# Patient Record
Sex: Male | Born: 1980
Health system: Southern US, Community
[De-identification: ages and names within clinical notes are randomized; demographics above are authoritative.]

## PROBLEM LIST (undated history)

## (undated) DIAGNOSIS — F1010 Alcohol abuse, uncomplicated: Secondary | ICD-10-CM

## (undated) DIAGNOSIS — F32A Depression, unspecified: Secondary | ICD-10-CM

## (undated) DIAGNOSIS — F329 Major depressive disorder, single episode, unspecified: Secondary | ICD-10-CM

---
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
## 2001-08-05 ENCOUNTER — Emergency Department (HOSPITAL_COMMUNITY): Admission: EM | Admit: 2001-08-05 | Discharge: 2001-08-05 | Payer: Self-pay | Admitting: *Deleted

## 2003-04-16 ENCOUNTER — Encounter: Payer: Self-pay | Admitting: Emergency Medicine

## 2003-04-16 ENCOUNTER — Emergency Department (HOSPITAL_COMMUNITY): Admission: EM | Admit: 2003-04-16 | Discharge: 2003-04-16 | Payer: Self-pay | Admitting: Emergency Medicine

## 2003-04-24 ENCOUNTER — Emergency Department (HOSPITAL_COMMUNITY): Admission: EM | Admit: 2003-04-24 | Discharge: 2003-04-24 | Payer: Self-pay | Admitting: Emergency Medicine

## 2015-05-21 ENCOUNTER — Ambulatory Visit (HOSPITAL_COMMUNITY): Payer: Self-pay | Admitting: Psychiatry

## 2015-09-26 ENCOUNTER — Encounter (HOSPITAL_COMMUNITY): Payer: Self-pay | Admitting: Emergency Medicine

## 2015-09-26 ENCOUNTER — Encounter (HOSPITAL_COMMUNITY): Payer: Self-pay

## 2015-09-26 ENCOUNTER — Emergency Department (HOSPITAL_COMMUNITY)
Admission: EM | Admit: 2015-09-26 | Discharge: 2015-09-26 | Disposition: A | Discharge status: Other Acute Inpatient Hospital | Payer: BLUE CROSS/BLUE SHIELD | Attending: Emergency Medicine | Admitting: Emergency Medicine

## 2015-09-26 ENCOUNTER — Inpatient Hospital Stay (HOSPITAL_COMMUNITY)
Admission: AD | Admit: 2015-09-26 | Discharge: 2015-09-29 | DRG: 885 | Disposition: A | Discharge status: Rehabilitation Facility | Payer: BLUE CROSS/BLUE SHIELD | Source: Intra-hospital | Attending: Psychiatry | Admitting: Psychiatry

## 2015-09-26 DIAGNOSIS — Z79899 Other long term (current) drug therapy: Secondary | ICD-10-CM | POA: Diagnosis not present

## 2015-09-26 DIAGNOSIS — F10239 Alcohol dependence with withdrawal, unspecified: Secondary | ICD-10-CM | POA: Diagnosis present

## 2015-09-26 DIAGNOSIS — R45851 Suicidal ideations: Secondary | ICD-10-CM | POA: Diagnosis present

## 2015-09-26 DIAGNOSIS — F1020 Alcohol dependence, uncomplicated: Secondary | ICD-10-CM | POA: Diagnosis present

## 2015-09-26 DIAGNOSIS — F10120 Alcohol abuse with intoxication, uncomplicated: Secondary | ICD-10-CM | POA: Diagnosis not present

## 2015-09-26 DIAGNOSIS — F419 Anxiety disorder, unspecified: Secondary | ICD-10-CM | POA: Diagnosis present

## 2015-09-26 DIAGNOSIS — F339 Major depressive disorder, recurrent, unspecified: Principal | ICD-10-CM | POA: Diagnosis present

## 2015-09-26 DIAGNOSIS — G4700 Insomnia, unspecified: Secondary | ICD-10-CM | POA: Diagnosis present

## 2015-09-26 DIAGNOSIS — Z880 Allergy status to penicillin: Secondary | ICD-10-CM | POA: Diagnosis not present

## 2015-09-26 DIAGNOSIS — F10920 Alcohol use, unspecified with intoxication, uncomplicated: Secondary | ICD-10-CM

## 2015-09-26 DIAGNOSIS — F1310 Sedative, hypnotic or anxiolytic abuse, uncomplicated: Secondary | ICD-10-CM | POA: Diagnosis present

## 2015-09-26 DIAGNOSIS — F17200 Nicotine dependence, unspecified, uncomplicated: Secondary | ICD-10-CM | POA: Diagnosis present

## 2015-09-26 DIAGNOSIS — Z818 Family history of other mental and behavioral disorders: Secondary | ICD-10-CM

## 2015-09-26 HISTORY — DX: Alcohol abuse, uncomplicated: F10.10

## 2015-09-26 LAB — COMPREHENSIVE METABOLIC PANEL
ALK PHOS: 61 U/L (ref 38–126)
ALT: 35 U/L (ref 17–63)
ANION GAP: 11 (ref 5–15)
AST: 49 U/L — ABNORMAL HIGH (ref 15–41)
Albumin: 4.6 g/dL (ref 3.5–5.0)
BUN: 8 mg/dL (ref 6–20)
CALCIUM: 9 mg/dL (ref 8.9–10.3)
CO2: 24 mmol/L (ref 22–32)
CREATININE: 0.67 mg/dL (ref 0.61–1.24)
Chloride: 105 mmol/L (ref 101–111)
Glucose, Bld: 117 mg/dL — ABNORMAL HIGH (ref 65–99)
Potassium: 3.9 mmol/L (ref 3.5–5.1)
Sodium: 140 mmol/L (ref 135–145)
Total Bilirubin: 0.7 mg/dL (ref 0.3–1.2)
Total Protein: 7.6 g/dL (ref 6.5–8.1)

## 2015-09-26 LAB — CBC
HEMATOCRIT: 45.2 % (ref 39.0–52.0)
HEMOGLOBIN: 15.7 g/dL (ref 13.0–17.0)
MCH: 33.3 pg (ref 26.0–34.0)
MCHC: 34.7 g/dL (ref 30.0–36.0)
MCV: 95.8 fL (ref 78.0–100.0)
Platelets: 296 10*3/uL (ref 150–400)
RBC: 4.72 MIL/uL (ref 4.22–5.81)
RDW: 12.5 % (ref 11.5–15.5)
WBC: 9.1 10*3/uL (ref 4.0–10.5)

## 2015-09-26 LAB — RAPID URINE DRUG SCREEN, HOSP PERFORMED
Amphetamines: NOT DETECTED
BENZODIAZEPINES: POSITIVE — AB
Barbiturates: NOT DETECTED
COCAINE: NOT DETECTED
OPIATES: NOT DETECTED
Tetrahydrocannabinol: NOT DETECTED

## 2015-09-26 LAB — ETHANOL
ALCOHOL ETHYL (B): 371 mg/dL — AB (ref <5)
Alcohol, Ethyl (B): 210 mg/dL — ABNORMAL HIGH (ref <5)

## 2015-09-26 MED ORDER — LORAZEPAM 1 MG PO TABS: 0.0000 mg | ORAL_TABLET | Freq: Two times a day (BID) | ORAL | Status: DC

## 2015-09-26 MED ORDER — CHLORDIAZEPOXIDE HCL 25 MG PO CAPS: 25.0000 mg | ORAL_CAPSULE | Freq: Every day | ORAL | Status: DC

## 2015-09-26 MED ORDER — VITAMIN B-1 100 MG PO TABS: 100.0000 mg | ORAL_TABLET | Freq: Every day | ORAL | Status: DC

## 2015-09-26 MED ORDER — LORAZEPAM 2 MG/ML IJ SOLN
0.0000 mg | Freq: Two times a day (BID) | INTRAMUSCULAR | Status: DC
  Filled 2015-09-26: qty 1

## 2015-09-26 MED ORDER — THIAMINE HCL 100 MG/ML IJ SOLN: 100.0000 mg | Freq: Once | INTRAMUSCULAR | Status: DC

## 2015-09-26 MED ORDER — LORAZEPAM 2 MG/ML IJ SOLN
0.0000 mg | Freq: Four times a day (QID) | INTRAMUSCULAR | Status: DC
  Administered 2015-09-26: 1 mg via INTRAVENOUS
  Filled 2015-09-26: qty 1

## 2015-09-26 MED ORDER — ALUM & MAG HYDROXIDE-SIMETH 200-200-20 MG/5ML PO SUSP: 30.0000 mL | ORAL | Status: DC | PRN

## 2015-09-26 MED ORDER — HYDROXYZINE HCL 25 MG PO TABS
25.0000 mg | ORAL_TABLET | Freq: Four times a day (QID) | ORAL | Status: DC | PRN
  Administered 2015-09-27 – 2015-09-28 (×2): 25 mg via ORAL
  Filled 2015-09-26 (×2): qty 1

## 2015-09-26 MED ORDER — MAGNESIUM HYDROXIDE 400 MG/5ML PO SUSP: 30.0000 mL | Freq: Every day | ORAL | Status: DC | PRN

## 2015-09-26 MED ORDER — CHLORDIAZEPOXIDE HCL 25 MG PO CAPS: 25.0000 mg | ORAL_CAPSULE | ORAL | Status: DC

## 2015-09-26 MED ORDER — ACETAMINOPHEN 325 MG PO TABS: 650.0000 mg | ORAL_TABLET | Freq: Four times a day (QID) | ORAL | Status: DC | PRN

## 2015-09-26 MED ORDER — THIAMINE HCL 100 MG/ML IJ SOLN: 100.0000 mg | Freq: Every day | INTRAMUSCULAR | Status: DC

## 2015-09-26 MED ORDER — LORAZEPAM 2 MG/ML IJ SOLN
0.0000 mg | Freq: Two times a day (BID) | INTRAMUSCULAR | Status: DC
Start: 2015-09-28 — End: 2015-09-26

## 2015-09-26 MED ORDER — CHLORDIAZEPOXIDE HCL 25 MG PO CAPS
25.0000 mg | ORAL_CAPSULE | Freq: Four times a day (QID) | ORAL | Status: AC
  Administered 2015-09-27 – 2015-09-28 (×6): 25 mg via ORAL
  Filled 2015-09-26 (×6): qty 1

## 2015-09-26 MED ORDER — VITAMIN B-1 100 MG PO TABS
100.0000 mg | ORAL_TABLET | Freq: Every day | ORAL | Status: DC
  Administered 2015-09-27 – 2015-09-29 (×3): 100 mg via ORAL
  Filled 2015-09-26 (×5): qty 1

## 2015-09-26 MED ORDER — LOPERAMIDE HCL 2 MG PO CAPS: 2.0000 mg | ORAL_CAPSULE | ORAL | Status: DC | PRN

## 2015-09-26 MED ORDER — NICOTINE 14 MG/24HR TD PT24
14.0000 mg | MEDICATED_PATCH | Freq: Every day | TRANSDERMAL | Status: DC
  Administered 2015-09-26: 14 mg via TRANSDERMAL
  Filled 2015-09-26 (×2): qty 1

## 2015-09-26 MED ORDER — CHLORDIAZEPOXIDE HCL 25 MG PO CAPS
25.0000 mg | ORAL_CAPSULE | Freq: Three times a day (TID) | ORAL | Status: AC
  Administered 2015-09-28 – 2015-09-29 (×3): 25 mg via ORAL
  Filled 2015-09-26 (×3): qty 1

## 2015-09-26 MED ORDER — LORAZEPAM 1 MG PO TABS: 0.0000 mg | ORAL_TABLET | Freq: Four times a day (QID) | ORAL | Status: DC

## 2015-09-26 MED ORDER — LORAZEPAM 2 MG/ML IJ SOLN
0.0000 mg | Freq: Four times a day (QID) | INTRAMUSCULAR | Status: DC
  Administered 2015-09-26: 2 mg via INTRAVENOUS

## 2015-09-26 MED ORDER — THIAMINE HCL 100 MG/ML IJ SOLN
100.0000 mg | Freq: Every day | INTRAMUSCULAR | Status: DC
  Filled 2015-09-26: qty 2

## 2015-09-26 MED ORDER — LEVETIRACETAM 500 MG PO TABS
500.0000 mg | ORAL_TABLET | Freq: Two times a day (BID) | ORAL | Status: DC
  Administered 2015-09-27 – 2015-09-29 (×5): 500 mg via ORAL
  Filled 2015-09-26 (×9): qty 1

## 2015-09-26 MED ORDER — THIAMINE HCL 100 MG/ML IJ SOLN
100.0000 mg | Freq: Every day | INTRAMUSCULAR | Status: DC
  Administered 2015-09-26: 100 mg via INTRAVENOUS
  Filled 2015-09-26: qty 2

## 2015-09-26 MED ORDER — ONDANSETRON 4 MG PO TBDP: 4.0000 mg | ORAL_TABLET | Freq: Four times a day (QID) | ORAL | Status: DC | PRN

## 2015-09-26 MED ORDER — ADULT MULTIVITAMIN W/MINERALS CH
1.0000 | ORAL_TABLET | Freq: Every day | ORAL | Status: DC
  Administered 2015-09-27 – 2015-09-29 (×3): 1 via ORAL
  Filled 2015-09-26 (×5): qty 1

## 2015-09-26 MED ORDER — LORAZEPAM 1 MG PO TABS
0.0000 mg | ORAL_TABLET | Freq: Four times a day (QID) | ORAL | Status: DC
Start: 2015-09-26 — End: 2015-09-26
  Administered 2015-09-26: 1 mg via ORAL
  Filled 2015-09-26: qty 1

## 2015-09-26 MED ORDER — CHLORDIAZEPOXIDE HCL 25 MG PO CAPS
25.0000 mg | ORAL_CAPSULE | Freq: Four times a day (QID) | ORAL | Status: DC | PRN
  Administered 2015-09-27: 25 mg via ORAL
  Filled 2015-09-26: qty 1

## 2015-09-26 NOTE — ED Provider Notes (Signed)
CSN: [id number]     Arrival date & time 09/26/15  1015 History   First MD Initiated Contact with Patient 09/26/15 1133     Chief Complaint  Patient presents with  . Alcohol Intoxication  . Suicidal   LEVEL 5 CAVEAT DUE TO INTOXICATION  Patient is a 35 y.o. male presenting with intoxication. The history is provided by the patient.  Alcohol Intoxication This is a new problem. Episode onset: unknown. The problem occurs constantly. The problem has been gradually worsening. Nothing aggravates the symptoms. Nothing relieves the symptoms.  patient is here for alcohol intoxication and also for suicidal ideation He also reports abdominal pain  Past Medical History  Diagnosis Date  . Alcohol abuse    No past surgical history on file. No family history on file. Social History  Substance Use Topics  . Smoking status: Not on file  . Smokeless tobacco: Not on file  . Alcohol Use: Not on file    Review of Systems  Unable to perform ROS: Mental status change      Allergies  Penicillins and Sulfa antibiotics  Home Medications   Prior to Admission medications   Medication Sig Start Date End Date Taking? Authorizing Provider  chlordiazePOXIDE (LIBRIUM) 10 MG capsule Take 10 mg by mouth 2 (two) times daily. 08/12/15  Yes Historical Provider, MD  levETIRAcetam (KEPPRA) 500 MG tablet Take 500 mg by mouth 2 (two) times daily. 04/16/15 04/15/16 Yes Historical Provider, MD   BP 133/101 mmHg  Pulse 112  Temp(Src) 97.5 F (36.4 C) (Oral)  Resp 20  SpO2 97% Physical Exam CONSTITUTIONAL: Disheveled, smells of ETOH HEAD: Normocephalic/atraumatic EYES: EOMI ENMT: Mucous membranes moist, tobacco in mouth NECK: supple no meningeal signs SPINE/BACK:entire spine nontender CV: S1/S2 noted, no murmurs/rubs/gallops noted LUNGS: Lungs are clear to auscultation bilaterally, no apparent distress ABDOMEN: soft, nontender NEURO: Pt is awake/alert/appropriate, moves all extremitiesx4. Pt is able to  ambulate EXTREMITIES:  full ROM SKIN: warm, color normal PSYCH: unable to fully assess  ED Course  Procedures  2:07 PM Pt will need psych evaluation  Labs Review Labs Reviewed  ETHANOL - Abnormal; Notable for the following:    Alcohol, Ethyl (B) 371 (*)    All other components within normal limits  COMPREHENSIVE METABOLIC PANEL - Abnormal; Notable for the following:    Glucose, Bld 117 (*)    AST 49 (*)    All other components within normal limits  CBC  URINE RAPID DRUG SCREEN, HOSP PERFORMED    I have personally reviewed and evaluated these  lab results as part of my medical decision-making.  Medications  thiamine (VITAMIN B-1) tablet 100 mg (not administered)    Or  thiamine (B-1) injection 100 mg (not administered)  LORazepam (ATIVAN) tablet 0-4 mg (0 mg Oral Hold 09/26/15 1335)    Or  LORazepam (ATIVAN) injection 0-4 mg ( Intravenous See Alternative 09/26/15 1335)  LORazepam (ATIVAN) tablet 0-4 mg (not administered)    Or  LORazepam (ATIVAN) injection 0-4 mg (not administered)    MDM   Final diagnoses:  Alcohol intoxication, uncomplicated (HCC)  Suicidal ideation    Nursing notes including past medical history and social history reviewed and considered in documentation Labs/vital reviewed myself and considered during evaluation     Zadie Rhine, MD 09/26/15 1407

## 2015-09-26 NOTE — ED Notes (Signed)
Meal tray was given to pt.  

## 2015-09-26 NOTE — BH Assessment (Signed)
Received information from [facility], RN, [facility] who states that patients diastolic blood pressure - bottom number should be retested and below 100 and requested his BAL be rechecked after 6:00PM for patient to be considered at [facility]. Patients nurse information.    Davina Poke, LCSW Therapeutic Triage Specialist [facility] 09/26/2015 3:28 PM

## 2015-09-26 NOTE — ED Notes (Signed)
Pt was at a friends house and expressed SI with no plan and was drinking heavy amounts of liquor and beer. They called a friend that dropped pt off at ER. Pt expresses that he does have some thoughts of SI due to stress and drinking, he drinks heavy daily, abd pain with this. Not able to stand, gait off balance. C/o nausea, dizziness.

## 2015-09-26 NOTE — ED Notes (Addendum)
Pt is asleep on his back with regular respirations. He is to be transported to [facility] in about one hour. Pt does appear comfortable. 8:15pm _pt given a pitcher of gatorade to drink. He denies Si and HI. CIWA is a 1. 8:30pm _Phoned [facility] and was asked to hold off sending the pt until they complete the present admission that they have. Per [facility] they will phone me back. 9:40pm _CIWA is a 5. Phoned [facility] to give report. Report given to [facility]. Will phone [facility] at 10pm,. Pt given  of ativan IV. 10pm Pts IV pulled.Pt stated he knows he has a drinking problem and often times will try to detox himself. He stated,'I need to talk to my attorney because I think I have to get into a 30 day program before my court date which is March 17th 2017. "Per [facility] they will be here in 25-30minutes to transport the pt. Pt left via [facility] at 10:20pm -Phoned [facility] to notify them pt is on his way

## 2015-09-26 NOTE — ED Notes (Signed)
Per Kinesha at [facility], pt can be transferred there in about an hour when his ETOH should be below 200

## 2015-09-26 NOTE — BH Assessment (Signed)
Assessment completed. Consulted with Dahlia Byes, NP who recommends inpatient treatment at this time.  Contacted Gretta Arab, RN, [facility] for bed availability at [facility].   Davina Poke, LCSW Therapeutic Triage Specialist [facility] 09/26/2015 3:12 PM

## 2015-09-26 NOTE — ED Notes (Signed)
ETOH 371 - Alerted primary nurse

## 2015-09-26 NOTE — BH Assessment (Addendum)
Assessment Note  Steven Gonzalez is an 35 y.o. male presenting to [facility] voluntarily stating that he "drank last night and woke up and called a friend and said I would be a little bit late, showed up to work and pretty much decided that it's time to check myself in." Patient states that he spent most of the day on the phone with [facility] who told him that he would have to pat $4600 to check in.  Patient states that "a friend of mine had his mom come pick me up and take me here." Patient states that he came in requesting detox. Patient states that he told a friend at work that he "has a problem with alcohol and I need to check myself in" and a friend from work called his mother to transport him to the hospital.  Patient states that he had a passive fleeting suicidal thought "like why should I get up, I don't want to be here" but states that he did not have intent or plan and that thought passed.  Patient states that he had one suicide attempt in September of 2016 where he overdosed and was admitted to [facility] but has not had any attempts since that time. Patient denies current SI with no intent or plan at this time. Patient denies self injurious behaviors. Patient denies HI and history of aggression. Patient states that he has an upcoming court date of October 22, 2015 for a DUI and denies other pending charges and states that he is not on probation.  Patient denies AVH and does not appear to be responding to internal stimuli. Patient states that he started drinking at the age of 89 and drinks at least "six to eight drinks of beer and liquor mixed" daily. Patient states that he has experienced DT's in the past and "convulsions" but denies having seizures. Patient BAL is 371 at time of arrival and UDS not collected at time of assessment.   Patient is alert and oriented x4. Patient appeared drowsy at first as he had just been given  of Ativan but appeared to answer questions appropriately with partially  impaired judgement.  Patient states that he has been seeing Dr. Evelene Croon for "a couple months" and his last appointment was Friday and he states that Dr. Evelene Croon stopped his librium although he is not sure why because he was not drinking while taking the medication. Patient states that he saw a therapist once but stopped going because the therapist was a "dip shit" and "did not know what he was doing." Patient states that he attempted suicide in September stating that his main stressor was "life" and "alcoholism" and he "took like fifty pills" and was admitted to [facility] for about one week. Patient denies other inpatient hospitalizations. Patient denies having access to weapons. Patient denies physical abuse and requested to skip the sexual abuse questions. Patient states that his family is very supportive. Patient requested to be referred to a "court approved" treatment facility for his upcoming court date in March.    Consulted with Dahlia Byes, PMH-NP who recommends inpatient treatment at this time.   Diagnosis: Alcohol use disorder, Severe         Unspecified depressive disorder  Past Medical History:  Past Medical History  Diagnosis Date  . Alcohol abuse     No past surgical history on file.  Family History: No family history on file.  Social History:  has no tobacco, alcohol, and drug history on file.  Additional Social History:  Alcohol / Drug Use Pain Medications: See PTA Prescriptions: See PTA Over the Counter: See PTA History of alcohol / drug use?: Yes Longest period of sobriety (when/how long): 90 days Negative Consequences of Use: Legal Withdrawal Symptoms: Tremors, Other (Comment) ("convulsions") Substance #1 Name of Substance 1: Alcohol 1 - Age of First Use: 18 1 - Amount (size/oz): 6-12 beers or combined with liquor 1 - Frequency: daily 1 - Duration: ongoing 1 - Last Use / Amount: this morning, UKN amount  CIWA: CIWA-Ar BP: (!) 133/101 mmHg Pulse Rate:  112 Nausea and Vomiting: no nausea and no vomiting Tactile Disturbances: none Tremor: moderate, with patient's arms extended Auditory Disturbances: not present Paroxysmal Sweats: beads of sweat obvious on forehead Visual Disturbances: not present Anxiety: three Headache, Fullness in Head: mild Agitation: moderately fidgety and restless Orientation and Clouding of Sensorium: oriented and can do serial additions CIWA-Ar Total: 17 COWS:    Allergies:  Allergies  Allergen Reactions  . Penicillins Anaphylaxis    Child hood allergy.  Has patient had a PCN reaction causing immediate rash, facial/tongue/throat swelling, SOB or lightheadedness with hypotension: Yes Has patient had a PCN reaction causing severe rash involving mucus membranes or skin necrosis: Yes Has patient had a PCN reaction that required hospitalization Yes Has patient had a PCN reaction occurring within the last 10 years: No If all of the above answers are "NO", then may proceed with   . Sulfa Antibiotics Anaphylaxis    Home Medications:  (Not in a hospital admission)  OB/GYN Status:  No LMP for male patient.  General Assessment Data [facility]: [facility] Assessment: In system Is this a Tele or Face-to-Face Assessment?: Face-to-Face Is this an Initial Assessment or a Re-assessment for this encounter?: Initial Assessment Marital status: Divorced (and engaged) Is patient pregnant?: No Pregnancy Status: No Living Arrangements: Alone Can pt return to current living arrangement?: Yes Admission Status: Voluntary Is patient capable of signing voluntary admission?: Yes Referral Source: Self/Family/Friend Insurance type: BCBS     Crisis Care Plan Living Arrangements: Alone Name of Psychiatrist: Dr. Evelene Croon ("a couple months" last appt Friday) Name of Therapist: None  Education Status Is patient currently in school?: No Highest grade of school patient has completed: 10th  Risk to self with the past  6 months Suicidal Ideation: No Has patient been a risk to self within the past 6 months prior to admission? : Yes Suicidal Intent: No Has patient had any suicidal intent within the past 6 months prior to admission? : Yes Is patient at risk for suicide?: No Suicidal Plan?: No Has patient had any suicidal plan within the past 6 months prior to admission? : Yes Access to Means: No What has been your use of drugs/alcohol within the last 12 months?: Alcohol daily Previous Attempts/Gestures: Yes How many times?: 1 (overdose in September "took like 50 pills") Other Self Harm Risks: Denies Triggers for Past Attempts: Other (Comment) ("alcoholism") Intentional Self Injurious Behavior: None Family Suicide History: No Recent stressful life event(s): Other (Comment) ("life") Persecutory voices/beliefs?: No Depression: Yes Depression Symptoms: Fatigue, Guilt, Loss of interest in usual pleasures Substance abuse history and/or treatment for substance abuse?: Yes Suicide prevention information given to non-admitted patients: Not applicable  Risk to Others within the past 6 months Homicidal Ideation: No Does patient have any lifetime risk of violence toward others beyond the six months prior to admission? : No Thoughts of Harm to Others: No Current Homicidal Intent: No Current Homicidal  Plan: No Access to Homicidal Means: No Identified Victim: Denies History of harm to others?: No Assessment of Violence: None Noted Violent Behavior Description: Denies Does patient have access to weapons?: No (patient states that someone else has his weapons) Criminal Charges Pending?: Yes Describe Pending Criminal Charges: DUI Does patient have a court date: Yes Court Date: 10/22/15 Is patient on probation?: No  Psychosis Hallucinations: None noted Delusions: None noted  Mental Status Report Appearance/Hygiene: In scrubs Eye Contact: Fair Motor Activity: Unable to assess Speech:  Logical/coherent Level of Consciousness: Alert Mood: Anxious Affect: Anxious Anxiety Level: Moderate Thought Processes: Coherent, Relevant Judgement: Partial Orientation: Person, Place, Time, Situation, Appropriate for developmental age Obsessive Compulsive Thoughts/Behaviors: None  Cognitive Functioning Concentration: Decreased Memory: Recent Intact, Remote Intact IQ: Average Insight: Fair Impulse Control: Fair Appetite: Fair Weight Loss: 0 Weight Gain: 0 Sleep:  (varies) Vegetative Symptoms: None  ADLScreening [facility] Assessment Services) Patient's cognitive ability adequate to safely complete daily activities?: Yes Patient able to express need for assistance with ADLs?: Yes Independently performs ADLs?: Yes (appropriate for developmental age)  Prior Inpatient Therapy Prior Inpatient Therapy: Yes Prior Therapy Dates: 04/2015 Prior Therapy Facilty/Provider(s): [facility] Reason for Treatment: SI  Prior Outpatient Therapy Prior Outpatient Therapy: Yes Prior Therapy Dates: Present Prior Therapy Facilty/Provider(s): Dr. Evelene Croon Reason for Treatment: SA Does patient have an ACCT team?: No Does patient have Intensive In-House Services?  : No Does patient have [facility] services? : No Does patient have P4CC services?: No  ADL Screening (condition at time of admission) Patient's cognitive ability adequate to safely complete daily activities?: Yes Is the patient deaf or have difficulty hearing?: No Does the patient have difficulty seeing, even when wearing glasses/contacts?: No Does the patient have difficulty concentrating, remembering, or making decisions?: No Patient able to express need for assistance with ADLs?: Yes Does the patient have difficulty dressing or bathing?: No Independently performs ADLs?: Yes (appropriate for developmental age) Does the patient have difficulty walking or climbing stairs?: No Weakness of Legs: None Weakness of Arms/Hands: None  Home Assistive  Devices/Equipment Home Assistive Devices/Equipment: None  Therapy Consults (therapy consults require a physician order) PT Evaluation Needed: No OT Evalulation Needed: No SLP Evaluation Needed: No Abuse/Neglect Assessment (Assessment to be complete while patient is alone) Physical Abuse: Denies Verbal Abuse: Denies Sexual Abuse:  (prefers not to answer, feels safe now) Exploitation of patient/patient's resources: Denies Self-Neglect: Denies Values / Beliefs Cultural Requests During Hospitalization: None Spiritual Requests During Hospitalization: None Consults Spiritual Care Consult Needed: No Social Work Consult Needed: No Merchant navy officer (For Healthcare) Does patient have an advance directive?: No Would patient like information on creating an advanced directive?: No - patient declined information    Additional Information 1:1 In Past 12 Months?: No CIRT Risk: No Elopement Risk: No Does patient have medical clearance?: No     Disposition:  Disposition Initial Assessment Completed for this Encounter: Yes  On Site Evaluation by:   Reviewed with Physician:    Claus Silvestro 09/26/2015 2:51 PM

## 2015-09-27 ENCOUNTER — Encounter (HOSPITAL_COMMUNITY): Payer: Self-pay | Admitting: Psychiatry

## 2015-09-27 DIAGNOSIS — F1020 Alcohol dependence, uncomplicated: Secondary | ICD-10-CM

## 2015-09-27 DIAGNOSIS — F339 Major depressive disorder, recurrent, unspecified: Principal | ICD-10-CM

## 2015-09-27 MED ORDER — NICOTINE POLACRILEX 2 MG MT GUM
2.0000 mg | CHEWING_GUM | OROMUCOSAL | Status: DC | PRN
  Administered 2015-09-27 – 2015-09-29 (×9): 2 mg via ORAL
  Filled 2015-09-27 (×2): qty 1

## 2015-09-27 NOTE — Progress Notes (Signed)
Pt did not attend evening AA group. Pt was in bed.  
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
## 2015-09-27 NOTE — Tx Team (Signed)
Initial Interdisciplinary Treatment Plan   PATIENT STRESSORS: Financial difficulties Legal issue Medication change or noncompliance Substance abuse   PATIENT STRENGTHS: Active sense of humor Average or above average intelligence Capable of independent living General fund of knowledge Motivation for treatment/growth Supportive family/friends   PROBLEM LIST: Problem List/Patient Goals Date to be addressed Date deferred Reason deferred Estimated date of resolution  "go to a long term treatment center" 09/26/2015     depression 09/26/2015     anxiety 09/26/2015     Risk for suicide 09/26/2015     Substance abuse 09/26/2015                              DISCHARGE CRITERIA:  Improved stabilization in mood, thinking, and/or behavior Motivation to continue treatment in a less acute level of care Reduction of life-threatening or endangering symptoms to within safe limits Withdrawal symptoms are absent or subacute and managed without 24-hour nursing intervention  PRELIMINARY DISCHARGE PLAN: Attend aftercare/continuing care group Attend 12-step recovery group Return to previous living arrangement Return to previous work or school arrangements  PATIENT/FAMIILY INVOLVEMENT: This treatment plan has been presented to and reviewed with the patient, Steven Gonzalez, The patient and family have been given the opportunity to ask questions and make suggestions.  JEHU-APPIAH, Erasmus Bistline K 09/27/2015, 1:25 AM

## 2015-09-27 NOTE — [facility] Group Notes (Signed)
[facility] LCSW Group Therapy 09/27/2015  1:15 PM   Type of Therapy: Group Therapy  Participation Level: Did Not Attend. Patient invited to participate but declined.   Elimelech Houseman, MSW, LCSW Clinical Social Worker [facility] [phone]   

## 2015-09-27 NOTE — Progress Notes (Signed)
Patient ID: Steven Gonzalez, male   DOB: 1981/03/07, 35 y.o.   MRN: [id number] oluntary admission in no acute distress for Admission note: D:Patient is a voluntary admission in no acute distress for anxiety, depression and substance abuse. Pt reports he has been drinking since age of 81. Pt reports he drinks 6 beers and 4 shots daily. Pt reports he is divorced but lives with finance and her child. Pt reports he was diagnosed with adjustment disorder and prescribed librium 30 mg BID. Pt stated his psychiatrist (Dr. Westley Chandler) discontinued the Librium and last dose was 09/21/2015. Pt reports hx of seizures with withdrawals and takes keppra. Pt reports 2 DUI with court date on 10/22/2015. Pt goal is to get into a long term treatment.  A: Pt admitted to unit per protocol, skin assessment and belonging search done. No skin issues noted. Consent signed by pt. Pt educated on therapeutic milieu rules. Pt was introduced to milieu by nursing staff. Fall risk safety plan explained to the patient. 15 minutes checks started for safety.  R: Pt was receptive to education. Writer offered support.

## 2015-09-27 NOTE — [facility] Group Notes (Signed)
[facility] LCSW Aftercare Discharge Planning Group Note  09/27/2015  8:45 AM  Participation Quality: Did Not Attend. Patient invited to participate but declined.  Garhett Bernhard, MSW, LCSW Clinical Social Worker [facility] [phone]   

## 2015-09-27 NOTE — Progress Notes (Signed)
DAR NOTE: Pt present with flat affect and depressed mood in the unit. Pt has been seen in the day room interacting with peers in the dayroom. Pt denies physical pain, took all his meds as scheduled. As per self inventory, pt had a fair night sleep, fair appetite, low energy, and good concentration. Pt rate depression at 2, hopeless ness at 2, and anxiety at 5. Pt goal for today is " speak with someone about long term treatment." Pt's safety ensured with 15 minute and environmental checks. Pt currently denies SI/HI and A/V hallucinations. Pt verbally agrees to seek staff if SI/HI or A/VH occurs and to consult with staff before acting on these thoughts. Will continue POC.

## 2015-09-28 MED ORDER — TRAZODONE HCL 50 MG PO TABS
50.0000 mg | ORAL_TABLET | Freq: Every evening | ORAL | Status: DC | PRN
  Administered 2015-09-28 (×2): 50 mg via ORAL
  Filled 2015-09-28 (×8): qty 1
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
## 2015-09-28 NOTE — Tx Team (Addendum)
Interdisciplinary Treatment Plan Update (Adult) Date: 09/28/2015    Time Reviewed: 9:30 AM  Progress in Treatment: Attending groups: Continuing to assess, patient new to milieu Participating in groups: Continuing to assess, patient new to milieu Taking medication as prescribed: Yes Tolerating medication: Yes Family/Significant other contact made: No, patient has declined for collateral contacts Patient understands diagnosis: Yes Discussing patient identified problems/goals with staff: Yes Medical problems stabilized or resolved: Yes Denies suicidal/homicidal ideation: Yes Issues/concerns per patient self-inventory: Yes Other:  New problem(s) identified: N/A  Discharge Plan or Barriers: Patient plans to go to [facility] [place] for continued treatment.  Reason for Continuation of Hospitalization:  Depression Anxiety Medication Stabilization   Comments: N/A  Estimated length of stay: Discharge anticipated for 09/29/15    Patient is a 35 year old male with a diagnosis of Substance Induced Mood Disorder and Alcohol Use Disorder. Pt presented to the hospital with alcohol abuse. Pt reports primary trigger(s) for admission was relationship and legal issues. Patient will benefit from crisis stabilization, medication evaluation, group therapy and psycho education in addition to case management for discharge planning. At discharge, it is recommended that Pt remain compliant with established discharge plan and continued treatment.   Review of initial/current patient goals per problem list:  1. Goal(s): Patient will participate in aftercare plan   Met: Yes   Target date: 3-5 days post admission date   As evidenced by: Patient will participate within aftercare plan AEB aftercare provider and housing plan at discharge being identified.  2/21: Goal not met: CSW assessing for appropriate referrals for pt and will have follow up secured prior to d/c.  2/22: Goal met. Patient plans  to go to [facility] [place] for continued treatment.     2. Goal (s): Patient will exhibit decreased depressive symptoms and suicidal ideations.   Met: Adequate for discharge per MD   Target date: 3-5 days post admission date   As evidenced by: Patient will utilize self rating of depression at 3 or below and demonstrate decreased signs of depression or be deemed stable for discharge by MD.  2/21: Goal not met: Pt presents with flat affect and depressed mood.  Pt admitted with depression rating of 10.  Pt to show decreased sign of depression and a rating of 3 or less before d/c.    2/22: Adequate for discharge. Patient reports an improvement in his symptoms, reports feeling safe for discharge today. Denies SI.    3. Goal(s): Patient will demonstrate decreased signs and symptoms of anxiety.   Met: Adequate for discharge per MD   Target date: 3-5 days post admission date   As evidenced by: Patient will utilize self rating of anxiety at 3 or below and demonstrated decreased signs of anxiety, or be deemed stable for discharge by MD   2/21: Goal not met: Pt presents with anxious mood and affect.  Pt admitted with anxiety rating of 10.  Pt to show decreased sign of anxiety and a rating of 3 or less before d/c.  2/22: Adequate for discharge. Patient reports an improvement in his symptoms, reports feeling safe for discharge today.    4. Goal(s): Patient will demonstrate decreased signs of withdrawal due to substance abuse   Met: Yes   Target date: 3-5 days post admission date   As evidenced by: Patient will produce a CIWA/COWS score of 0, have stable vitals signs, and no symptoms of withdrawal   2/21: Goal not met: Pt continues to have withdrawal  symptoms of tremor, sweating, anxiety and a CIWA score of a 5.  Pt to show decrease withdrawal symptoms prior to d/c.  2/22: Goal met. No withdrawal symptoms reported at this time per medical chart.     Attendees: Patient:     Family:    Physician: Dr. Parke Poisson; Dr. Sabra Heck 09/28/2015 9:30 AM  Nursing: Mayra Neer, Loletta Specter, [address], [place], [place] 09/28/2015 9:30 AM  Clinical Social Worker: Tilden Fossa, LCSW 09/28/2015 9:30 AM  Other: Peri Maris, LCSWA; [place], LCSW  09/28/2015 9:30 AM  Other:  09/28/2015 9:30 AM  Other: Lars Pinks, Case Manager 09/28/2015 9:30 AM  Other: Agustina Caroli, NP 09/28/2015 9:30 AM  Other:    Other:      Scribe for Treatment Team:  Tilden Fossa, [place]

## 2015-09-28 NOTE — Progress Notes (Signed)
Adult Psychoeducational Group Note  Date:  09/28/2015 Time:  8:20 PM  Group Topic/Focus:  Wrap-Up Group:   The focus of this group is to help patients review their daily goal of treatment and discuss progress on daily workbooks.  Participation Level:  Active  Participation Quality:  Appropriate  Affect:  Appropriate  Cognitive:  Appropriate  Insight: Appropriate  Engagement in Group:  Engaged  Modes of Intervention:  Discussion  Additional Comments:  Pt was pleasant during wrap-up group. Pt rated his overall day an 8 out of 10 because he achieve his goal for the day, which was "to put a plan together".   Cleotilde Neer 09/28/2015, 9:22 PM

## 2015-09-28 NOTE — Progress Notes (Deleted)
[facility] MD Progress Note  09/28/2015 6:01 PM [name]  MRN:  [id number] Subjective:  Steven Gonzalez is trying to get himself together. He is wanting to continue to work on his alcohol dependence by coming to a CD IOP. States that he does not want to got to a residential treatment program. 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 States he works with his parents. His father had agreed to take some of the pressure from him. States that the stress from work has set  the stage for his relapse many times Principal Problem: Major depressive disorder, recurrent, unspecified (HCC) Diagnosis:   Patient Active Problem List   Diagnosis Date Noted  . Alcohol use disorder, severe, dependence (HCC) [F10.20] 09/27/2015  . Major depressive disorder, recurrent, unspecified (HCC) [F33.9] 09/26/2015   Total Time spent with patient: 20 minutes  Past Psychiatric History: see admission H and P  Past Medical History:  Past Medical History  Diagnosis Date  . Alcohol abuse    History reviewed. No pertinent past surgical history. Family History: History reviewed. No pertinent family history. Family Psychiatric  History: see admission H and P Social History:  History  Alcohol Use: Not on file     History  Drug Use Not on file    Social History   Social History  . Marital Status: Single    Spouse Name: N/A  . Number of Children: N/A  . Years of Education: N/A   Social History Main Topics  . Smoking status: Current Every Day Smoker  . Smokeless tobacco: Current User    Types: Chew  . Alcohol Use: None  . Drug Use: None  . Sexual Activity: Yes   Other Topics Concern  . None   Social History Narrative   Additional Social History:                         Sleep: Fair  Appetite:  Fair  Current Medications: Current Facility-Administered Medications  Medication Dose Route Frequency  Provider Last Rate Last Dose  . acetaminophen (TYLENOL) tablet 650 mg  650 mg Oral Q6H PRN Worthy Flank, NP      . alum & mag hydroxide-simeth (MAALOX/MYLANTA) 200-200-20 MG/5ML suspension 30 mL  30 mL Oral Q4H PRN Worthy Flank, NP      . chlordiazePOXIDE (LIBRIUM) capsule 25 mg  25 mg Oral Q6H PRN Worthy Flank, NP   25 mg at 09/27/15 4098  . chlordiazePOXIDE (LIBRIUM) capsule 25 mg  25 mg Oral TID Worthy Flank, NP   25 mg at 09/28/15 1725   Followed by  . [START ON 09/29/2015] chlordiazePOXIDE (LIBRIUM) capsule 25 mg  25 mg Oral BH-qamhs Worthy Flank, NP       Followed by  . [START ON 10/01/2015] chlordiazePOXIDE (LIBRIUM) capsule 25 mg  25 mg Oral Daily Worthy Flank, NP      . hydrOXYzine (ATARAX/VISTARIL) tablet 25 mg  25 mg Oral Q6H PRN Worthy Flank, NP   25 mg at 09/27/15 2214  . levETIRAcetam (KEPPRA) tablet 500 mg  500 mg Oral BID Worthy Flank, NP   500 mg at 09/28/15 1725  . loperamide (IMODIUM) capsule 2-4 mg  2-4 mg Oral PRN Worthy Flank, NP      . magnesium hydroxide (MILK OF MAGNESIA) suspension 30 mL  30  mL Oral Daily PRN Worthy Flank, NP      . multivitamin with minerals tablet 1 tablet  1 tablet Oral Daily Worthy Flank, NP   1 tablet at 09/28/15 0827  . nicotine polacrilex (NICORETTE) gum 2 mg  2 mg Oral PRN Rachael Fee, MD   2 mg at 09/28/15 1311  . ondansetron (ZOFRAN-ODT) disintegrating tablet 4 mg  4 mg Oral Q6H PRN Worthy Flank, NP      . thiamine (B-1) injection 100 mg  100 mg Intramuscular Once Worthy Flank, NP   100 mg at 09/27/15 0009  . thiamine (VITAMIN B-1) tablet 100 mg  100 mg Oral Daily Worthy Flank, NP   100 mg at 09/28/15 4098    Lab Results: No results found for this or any previous visit (from the past 48 hour(s)).  Blood Alcohol level:  Lab Results  Component Value Date   ETH 371* 09/26/2015   ETH 210* 09/26/2015    Physical Findings: AIMS: Facial and Oral Movements Muscles of Facial Expression: None,  normal Lips and Perioral Area: None, normal Jaw: None, normal Tongue: None, normal,Extremity Movements Upper (arms, wrists, hands, fingers): None, normal Lower (legs, knees, ankles, toes): None, normal, Trunk Movements Neck, shoulders, hips: None, normal, Overall Severity Severity of abnormal movements (highest score from questions above): None, normal Incapacitation due to abnormal movements: None, normal Patient's awareness of abnormal movements (rate only patient's report): No Awareness, Dental Status Current problems with teeth and/or dentures?: No Does patient usually wear dentures?: No  CIWA:  CIWA-Ar Total: 3 COWS:     Musculoskeletal: Strength & Muscle Tone: within normal limits Gait & Station: normal Patient leans: normal  Psychiatric Specialty Exam: Review of Systems  Constitutional: Negative.   HENT: Negative.   Eyes: Negative.   Respiratory: Negative.   Cardiovascular: Negative.   Gastrointestinal: Negative.   Genitourinary: Negative.   Musculoskeletal: Negative.   Skin: Negative.   Neurological: Negative.   Endo/Heme/Allergies: Negative.   Psychiatric/Behavioral: Positive for depression and substance abuse. The patient is nervous/anxious.     Blood pressure 135/80, pulse 75, temperature 97.6 F (36.4 C), temperature source Oral, resp. rate 12, height  (1.626 m), weight 68.947 kg (152 lb).Body mass index is 26.08 kg/(m^2).  General Appearance: Fairly Groomed  Patent attorney::  Fair  Speech:  Clear and Coherent  Volume:  Normal  Mood:  Euthymic  Affect:  Restricted  Thought Process:  Coherent and Goal Directed  Orientation:  Full (Time, Place, and Person)  Thought Content:  symptoms events worries concerns  Suicidal Thoughts:  No  Homicidal Thoughts:  No  Memory:  Immediate;   Fair Recent;   Fair Remote;   Fair  Judgement:  Fair  Insight:  Present and Shallow  Psychomotor Activity:  Restlessness  Concentration:  Fair  Recall:  [employer] of  Knowledge:Fair  Language: Fair  Akathisia:  No  Handed:  Right  AIMS (if indicated):     Assets:  Desire for Improvement Housing Social Support Vocational/Educational  ADL's:  Intact  Cognition: WNL  Sleep:  Number of Hours: 6   Treatment Plan Summary: Daily contact with patient to assess and evaluate symptoms and progress in treatment and Medication management Supportive approach/coping skills Alcohol dependence; continue the Librium detox protocol/work a relapse prevention plan Depression; reassess for the use of an antidepressant Use CBT/mindfulness/stress management/problem solving Facilitate admission to the CD IOP Jaislyn Blinn A, MD 09/28/2015, 6:01 PM

## 2015-09-28 NOTE — B[facility] Suicide Risk Assessment (Signed)
[facility] INPATIENT:  Family/Significant Other Suicide Prevention Education  Suicide Prevention Education:  Patient Refusal for Family/Significant Other Suicide Prevention Education: The patient Steven Gonzalez has refused to provide written consent for family/significant other to be provided Family/Significant Other Suicide Prevention Education during admission and/or prior to discharge.  Physician notified. SPE reviewed with patient and brochure provided. Patient encouraged to return to hospital if having suicidal thoughts, patient verbalized his/her understanding and has no further questions at this time.   Shylee Durrett, [place] 09/28/2015, 3:47 PM

## 2015-09-28 NOTE — Progress Notes (Signed)
Writer found pt in bed at the beginning of the shift.  He reports that he is having mild to moderate withdrawal symptoms, but that the medications are helping.  He is receiving nicorette gum for his nicotine cravings.  He denies SI/HI/AVH.  He tells Clinical research associate that he wants to go to a long term treatment program, but that he needs to be discharged tomorrow to take care of business at home.  He was adamant that he needed to do these things tomorrow.  He told Clinical research associate that he had not talked to anyone today, but further conversation revealed that he had talked the the case manager and the MD, but that nothing had been decided definitely.  He seemed very anxious that he talk to someone first thing in the morning.  Writer will put a note for the MD, but informed pt that it did not mean he would be first to speak to the MD.  Pt was given his HS meds, and also required a prn Vistaril 25 mg.  Support and encouragement offered.  Safety maintained with q15 minute checks.

## 2015-09-28 NOTE — Progress Notes (Signed)
Patient resting in bed this morning, more visible later in day. Patient flat, anxious. Withdrawal symptoms include sweats, chills and diarrhea this AM which has resolved. Forwards minimal information however is cooperative. Rates his depression and hopelessness both at a 0/10, anxiety at a 4/10. Reports his goal is to speak with staff about continued tx after detox. Medicated per orders. Emotional support offered. Self inventory reviewed. He denies SI/HI and remains safe on level III obs. Steven Gonzalez

## 2015-09-28 NOTE — Plan of Care (Signed)
Problem: Alteration in mood; excessive anxiety as evidenced by: Goal: STG-Pt will report an absence of self-harm thoughts/actions (Patient will report an absence of self-harm thoughts or actions)  Outcome: Progressing Patient denies SI, thoughts to harm self.  Problem: Alteration in mood & ability to function due to Goal: STG-Patient will comply with prescribed medication regimen (Patient will comply with prescribed medication regimen)  Outcome: Progressing Patient has been med compliant.

## 2015-09-28 NOTE — Progress Notes (Signed)
Recreation Therapy Notes  Animal-Assisted Activity (AAA) Program Checklist/Progress Notes Patient Eligibility Criteria Checklist & Daily Group note for Rec Tx Intervention  Date: 02.21.2017 Time: 2:45pm Location: [address]   AAA/T Program Assumption of Risk Form signed by Patient/ or Parent Legal Guardian yes  Patient is free of allergies or sever asthma yes  Patient reports no fear of animals yes  Patient reports no history of cruelty to animals yes  Patient understands his/her participation is voluntary yes  Behavioral Response: Did not attend.   Steven Gonzalez, LRT/CTRS  Steven Gonzalez L 09/28/2015 3:07 PM

## 2015-09-28 NOTE — [facility] Group Notes (Signed)
[facility] LCSW Group Therapy 09/28/2015 1:15 PM Type of Therapy: Group Therapy Participation Level: Active  Participation Quality: Attentive, Sharing and Supportive  Affect: Appropriate  Cognitive: Alert and Oriented  Insight: Developing/Improving and Engaged  Engagement in Therapy: Developing/Improving and Engaged  Modes of Intervention: Activity, Clarification, Confrontation, Discussion, Education, Exploration, Limit-setting, Orientation, Problem-solving, Rapport Building, Reality Testing, Socialization and Support  Summary of Progress/Problems: Patient was attentive and engaged with speaker from [employer]. Patient was attentive to speaker while they shared their story of dealing with mental health and overcoming it. Patient expressed interest in their programs and services and received information on their agency. Patient processed ways they can relate to the speaker.   Breeley Bischof, LCSW Clinical Social Worker [facility] [phone]   
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
## 2015-09-29 MED ORDER — TRAZODONE HCL 50 MG PO TABS: 50.0000 mg | ORAL_TABLET | Freq: Every evening | ORAL | Status: AC | PRN

## 2015-09-29 MED ORDER — LEVETIRACETAM 500 MG PO TABS: 500.0000 mg | ORAL_TABLET | Freq: Two times a day (BID) | ORAL | Status: DC

## 2015-09-29 MED ORDER — NICOTINE POLACRILEX 2 MG MT GUM: 2.0000 mg | CHEWING_GUM | OROMUCOSAL | Status: AC | PRN

## 2015-09-29 NOTE — Progress Notes (Signed)
Pt has been in and out of his room this evening.  At the beginning of the shift, he was in bed asleep.  He did get up and go to the evening wrap up group.  Pt denies SI/HI/AVH.  He does endorse som mild to moderate withdrawal symptoms, but he is requesting medications as needed.  Pt's plan is to go for long term treatment after detox, and there are a couple of places that referrals have been sent.  He makes his needs known to staff.  Pt is pleasant and cooperative.  Support and encouragement offered.  Safety maintained with q15 minute checks.

## 2015-09-29 NOTE — [facility] Suicide Risk Assessment (Signed)
[facility] Discharge Suicide Risk Assessment   Principal Problem: Major depressive disorder, recurrent, unspecified (HCC) Discharge Diagnoses:  Patient Active Problem List   Diagnosis Date Noted  . Alcohol use disorder, severe, dependence (HCC) [F10.20] 09/27/2015  . Major depressive disorder, recurrent, unspecified (HCC) [F33.9] 09/26/2015    Total Time spent with patient: 20 minutes  Musculoskeletal: Strength & Muscle Tone: within normal limits Gait & Station: normal Patient leans: normal  Psychiatric Specialty Exam: Review of Systems  Constitutional: Negative.   HENT: Negative.   Eyes: Negative.   Respiratory: Negative.   Cardiovascular: Negative.   Gastrointestinal: Negative.   Genitourinary: Negative.   Musculoskeletal: Negative.   Skin: Negative.   Neurological: Negative.   Endo/Heme/Allergies: Negative.   Psychiatric/Behavioral: Positive for substance abuse. The patient is nervous/anxious.     Blood pressure 122/82, pulse 92, temperature 98.5 F (36.9 C), temperature source Oral, resp. rate 20, height  (1.626 m), weight 68.947 kg (152 lb).Body mass index is 26.08 kg/(m^2).  General Appearance: Fairly Groomed  Patent attorney::  Fair  Speech:  Clear and Coherent409  Volume:  Normal  Mood:  Anxious  Affect:  Appropriate  Thought Process:  Coherent and Goal Directed  Orientation:  Full (Time, Place, and Person)  Thought Content:  plans as he moves on, relapse prevention plan  Suicidal Thoughts:  No  Homicidal Thoughts:  No  Memory:  Immediate;   Fair Recent;   Fair Remote;   Fair  Judgement:  Fair  Insight:  Present  Psychomotor Activity:  Normal  Concentration:  Fair  Recall:  [employer] of Knowledge:Fair  Language: Fair  Akathisia:  No  Handed:  Right  AIMS (if indicated):     Assets:  Desire for Improvement Social Support  Sleep:  Number of Hours: 5.5  Cognition: WNL  ADL's:  Intact  In full contact with reality. There are no active SI plans or intent.  There are no active S/S of withdrawal. He is willing and motivated to pursue further residential treatment  Mental Status Per Nursing Assessment::   On Admission:  NA  Demographic Factors:  Male and Caucasian  Loss Factors: Loss of significant relationship and Legal issues  Historical Factors: none identified  Risk Reduction Factors:   Sense of responsibility to family, Employed and Positive social support  Continued Clinical Symptoms:  Depression:   Comorbid alcohol abuse/dependence Alcohol/Substance Abuse/Dependencies  Cognitive Features That Contribute To Risk:  None    Suicide Risk:  Minimal: No identifiable suicidal ideation.  Patients presenting with no risk factors but with morbid ruminations; may be classified as minimal risk based on the severity of the depressive symptoms  Follow-up Information    Follow up with [facility].   Why:  Please arrive by 8pm for admission for continued treatment. Call office if you are going to be late.   Contact information:   [address]  [place], [place] [address] [phone]      Plan Of Care/Follow-up recommendations:  Activity:  as tolerated Diet:  regular Follow up as above Chatara Lucente A, MD 09/29/2015, 11:15 AM
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
## 2015-09-29 NOTE — Progress Notes (Signed)
Recreation Therapy Notes  Date: 02.22.2017 Time: 9:30am Location: [address] Group Room   Group Topic: Stress Management  Goal Area(s) Addresses:  Patient will actively participate in stress management techniques presented during session.   Behavioral Response: Did not attend.   Marykay Lex Mattox Schorr, LRT/CTRS  Shawnette Augello L 09/29/2015 10:14 AM

## 2015-09-29 NOTE — Progress Notes (Signed)
Pt. Discharged per MD orders;  PT. Currently denies any HI/SI or AVH.  Pt. Was given education regarding follow up appointments and medications by RN.  Pt. Denies any questions or concerns about the medications.  Pt. Was escorted to the search room to retrieve his belongings by RN before being discharged to the hospital lobby.  
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
## 2015-09-29 NOTE — Progress Notes (Signed)
  [name] Adult Case Management Discharge Plan :  Will you be returning to the same living situation after discharge:  No. Patient will go to [facility] At discharge, do you have transportation home?: Yes,  patient will be provided with bus fare Do you have the ability to pay for your medications: Yes,  patient will be provided with prescriptions at discharge  Release of information consent forms completed and in the chart;  Patient's signature needed at discharge.  Patient to Follow up at: Follow-up Information    Follow up with [facility].   Why:  Please arrive by 8pm for admission for continued treatment. Call office if you are going to be late.   Contact information:   [address],  [place], [place] [address] [phone]      Next level of care provider has access to [facility] Link:no  Safety Planning and Suicide Prevention discussed: Yes,  with patient   Have you used any form of tobacco in the last 30 days? (Cigarettes, Smokeless Tobacco, Cigars, and/or Pipes): Yes  Has patient been referred to the Quitline?: Patient refused referral  Patient has been referred for addiction treatment: Yes  Steven Gonzalez, Steven Gonzalez 09/29/2015, 10:35 AM

## 2016-02-03 ENCOUNTER — Encounter (HOSPITAL_BASED_OUTPATIENT_CLINIC_OR_DEPARTMENT_OTHER): Payer: Self-pay | Admitting: *Deleted

## 2016-02-03 ENCOUNTER — Emergency Department (HOSPITAL_BASED_OUTPATIENT_CLINIC_OR_DEPARTMENT_OTHER): Payer: BLUE CROSS/BLUE SHIELD

## 2016-02-03 ENCOUNTER — Emergency Department (HOSPITAL_BASED_OUTPATIENT_CLINIC_OR_DEPARTMENT_OTHER)
Admission: EM | Admit: 2016-02-03 | Discharge: 2016-02-03 | Disposition: A | Discharge status: Home / Self Care | Payer: BLUE CROSS/BLUE SHIELD | Attending: Emergency Medicine | Admitting: Emergency Medicine

## 2016-02-03 DIAGNOSIS — S42131A Displaced fracture of coracoid process, right shoulder, initial encounter for closed fracture: Secondary | ICD-10-CM | POA: Diagnosis not present

## 2016-02-03 DIAGNOSIS — W051XXA Fall from non-moving nonmotorized scooter, initial encounter: Secondary | ICD-10-CM | POA: Diagnosis not present

## 2016-02-03 DIAGNOSIS — S40211A Abrasion of right shoulder, initial encounter: Secondary | ICD-10-CM | POA: Diagnosis not present

## 2016-02-03 DIAGNOSIS — Y999 Unspecified external cause status: Secondary | ICD-10-CM | POA: Diagnosis not present

## 2016-02-03 DIAGNOSIS — S4991XA Unspecified injury of right shoulder and upper arm, initial encounter: Secondary | ICD-10-CM | POA: Diagnosis present

## 2016-02-03 DIAGNOSIS — S0031XA Abrasion of nose, initial encounter: Secondary | ICD-10-CM | POA: Diagnosis not present

## 2016-02-03 DIAGNOSIS — Y92410 Unspecified street and highway as the place of occurrence of the external cause: Secondary | ICD-10-CM | POA: Insufficient documentation

## 2016-02-03 DIAGNOSIS — F17220 Nicotine dependence, chewing tobacco, uncomplicated: Secondary | ICD-10-CM | POA: Diagnosis not present

## 2016-02-03 DIAGNOSIS — Y9389 Activity, other specified: Secondary | ICD-10-CM | POA: Insufficient documentation

## 2016-02-03 MED ORDER — BACITRACIN ZINC 500 UNIT/GM EX OINT
TOPICAL_OINTMENT | Freq: Two times a day (BID) | CUTANEOUS | Status: DC
  Administered 2016-02-03: 12:00:00 via TOPICAL

## 2016-02-03 MED ORDER — HYDROCODONE-ACETAMINOPHEN 5-325 MG PO TABS
2.0000 | ORAL_TABLET | Freq: Once | ORAL | Status: AC
  Administered 2016-02-03: 2 via ORAL
  Filled 2016-02-03: qty 2

## 2016-02-03 MED ORDER — HYDROCODONE-ACETAMINOPHEN 5-325 MG PO TABS: 1.0000 | ORAL_TABLET | Freq: Four times a day (QID) | ORAL | Status: DC | PRN

## 2016-02-03 MED FILL — HYDROCODON-APAP 5-325: 5-325 | 2 days supply | Qty: 20 | Fill #0
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
## 2016-02-03 NOTE — ED Notes (Signed)
Patient transported to X-ray, medicated en route to radiology. Will scan when pt returns to department.

## 2017-02-21 ENCOUNTER — Encounter (HOSPITAL_COMMUNITY): Payer: Self-pay | Admitting: Emergency Medicine

## 2017-02-21 ENCOUNTER — Emergency Department (HOSPITAL_COMMUNITY)
Admission: EM | Admit: 2017-02-21 | Discharge: 2017-02-21 | Disposition: A | Discharge status: Home / Self Care | Payer: Managed Care, Other (non HMO) | Attending: Emergency Medicine | Admitting: Emergency Medicine

## 2017-02-21 DIAGNOSIS — F17200 Nicotine dependence, unspecified, uncomplicated: Secondary | ICD-10-CM | POA: Insufficient documentation

## 2017-02-21 DIAGNOSIS — R739 Hyperglycemia, unspecified: Secondary | ICD-10-CM

## 2017-02-21 DIAGNOSIS — F1010 Alcohol abuse, uncomplicated: Secondary | ICD-10-CM | POA: Insufficient documentation

## 2017-02-21 DIAGNOSIS — F329 Major depressive disorder, single episode, unspecified: Secondary | ICD-10-CM | POA: Insufficient documentation

## 2017-02-21 HISTORY — DX: Major depressive disorder, single episode, unspecified: F32.9

## 2017-02-21 HISTORY — DX: Depression, unspecified: F32.A

## 2017-02-21 LAB — COMPREHENSIVE METABOLIC PANEL
ALK PHOS: 106 U/L (ref 38–126)
ALT: 105 U/L — ABNORMAL HIGH (ref 17–63)
AST: 154 U/L — AB (ref 15–41)
Albumin: 4.2 g/dL (ref 3.5–5.0)
Anion gap: 16 — ABNORMAL HIGH (ref 5–15)
BILIRUBIN TOTAL: 0.7 mg/dL (ref 0.3–1.2)
CALCIUM: 8.8 mg/dL — AB (ref 8.9–10.3)
CO2: 18 mmol/L — ABNORMAL LOW (ref 22–32)
CREATININE: 0.83 mg/dL (ref 0.61–1.24)
Chloride: 101 mmol/L (ref 101–111)
Glucose, Bld: 294 mg/dL — ABNORMAL HIGH (ref 65–99)
Potassium: 2.9 mmol/L — ABNORMAL LOW (ref 3.5–5.1)
Sodium: 135 mmol/L (ref 135–145)
TOTAL PROTEIN: 7.1 g/dL (ref 6.5–8.1)

## 2017-02-21 LAB — CBC WITH DIFFERENTIAL/PLATELET
Basophils Absolute: 0 10*3/uL (ref 0.0–0.1)
Basophils Relative: 0 %
EOS ABS: 0.1 10*3/uL (ref 0.0–0.7)
Eosinophils Relative: 2 %
HEMATOCRIT: 48.2 % (ref 39.0–52.0)
HEMOGLOBIN: 16.4 g/dL (ref 13.0–17.0)
Lymphocytes Relative: 39 %
Lymphs Abs: 2.9 10*3/uL (ref 0.7–4.0)
MCH: 33.5 pg (ref 26.0–34.0)
MCHC: 34 g/dL (ref 30.0–36.0)
MCV: 98.4 fL (ref 78.0–100.0)
Monocytes Absolute: 0.6 10*3/uL (ref 0.1–1.0)
Monocytes Relative: 9 %
NEUTROS ABS: 3.7 10*3/uL (ref 1.7–7.7)
NEUTROS PCT: 50 %
Platelets: 218 10*3/uL (ref 150–400)
RBC: 4.9 MIL/uL (ref 4.22–5.81)
RDW: 12.8 % (ref 11.5–15.5)
WBC: 7.3 10*3/uL (ref 4.0–10.5)

## 2017-02-21 LAB — RAPID URINE DRUG SCREEN, HOSP PERFORMED
AMPHETAMINES: NOT DETECTED
Barbiturates: NOT DETECTED
Benzodiazepines: NOT DETECTED
Cocaine: NOT DETECTED
OPIATES: NOT DETECTED
Tetrahydrocannabinol: NOT DETECTED

## 2017-02-21 LAB — ACETAMINOPHEN LEVEL

## 2017-02-21 LAB — ETHANOL: Alcohol, Ethyl (B): 347 mg/dL (ref <5)

## 2017-02-21 LAB — SALICYLATE LEVEL

## 2017-02-21 MED ORDER — POTASSIUM CHLORIDE CRYS ER 20 MEQ PO TBCR
40.0000 meq | EXTENDED_RELEASE_TABLET | Freq: Two times a day (BID) | ORAL | Status: DC
  Administered 2017-02-21: 40 meq via ORAL
  Filled 2017-02-21: qty 2

## 2017-02-21 MED ORDER — CHLORDIAZEPOXIDE HCL 25 MG PO CAPS: ORAL_CAPSULE | ORAL | 0 refills | Status: AC

## 2017-02-21 MED ORDER — METFORMIN HCL 500 MG PO TABS
500.0000 mg | ORAL_TABLET | Freq: Once | ORAL | Status: AC
  Administered 2017-02-21: 500 mg via ORAL
  Filled 2017-02-21: qty 1

## 2017-02-21 MED ORDER — METFORMIN HCL 500 MG PO TABS: 500.0000 mg | ORAL_TABLET | Freq: Two times a day (BID) | ORAL | 2 refills | Status: AC

## 2017-02-21 NOTE — ED Notes (Signed)
Needs to go to detox, can't take time off for rehab

## 2017-02-21 NOTE — ED Provider Notes (Signed)
[facility]-EMERGENCY DEPT Provider Note   CSN: [id number] Arrival date & time: 02/21/17  0228     History   Chief Complaint Chief Complaint  Patient presents with  . Medical Clearance    HPI Jude Naclerio is a 36 y.o. male.  Patient presents to the emergency department for evaluation of alcohol abuse. He reports that he has had a 10 year history of severe alcohol abuse. He has been to detox several times in the past, has never been able to maintain sobriety. He has been increasing his drinking recently and realizes that he needs to stop. He admits to a history of chronic depression, but denies homicidality and suicidality. He feels safe.      Past Medical History:  Diagnosis Date  . Alcohol abuse   . Depression     Patient Active Problem List   Diagnosis Date Noted  . Alcohol use disorder, severe, dependence (HCC) 09/27/2015  . Major depressive disorder, recurrent, unspecified (HCC) 09/26/2015    History reviewed. No pertinent surgical history.     Home Medications    Prior to Admission medications   Medication Sig Start Date End Date Taking? Authorizing Provider  chlordiazePOXIDE (LIBRIUM) 25 MG capsule 50mg  PO TID x 1D, then 25-50mg  PO BID X 1D, then 25-50mg  PO QD X 1D 02/21/17   Loreena Valeri, Canary Brim, MD  HYDROcodone-acetaminophen (NORCO) 5-325 MG tablet Take 1-2 tablets by mouth every 6 (six) hours as needed for moderate pain or severe pain. 02/03/16   Doug Sou, MD  metFORMIN (GLUCOPHAGE) 500 MG tablet Take 1 tablet (500 mg total) by mouth 2 (two) times daily with a meal. 02/21/17   Otha Monical, Canary Brim, MD  nicotine polacrilex (NICORETTE) 2 MG gum Take 1 each (2 mg total) by mouth as needed for smoking cessation. 09/29/15   Armandina Stammer I, NP  traZODone (DESYREL) 50 MG tablet Take 1 tablet (50 mg total) by mouth at bedtime and may repeat dose one time if needed. For sleep 09/29/15   Armandina Stammer I, NP    Family History No family history on file.  Social  History Social History  Substance Use Topics  . Smoking status: Current Every Day Smoker  . Smokeless tobacco: Current User    Types: Chew  . Alcohol use Yes     Allergies   Penicillins and Sulfa antibiotics   Review of Systems Review of Systems  Psychiatric/Behavioral: Positive for dysphoric mood. Negative for self-injury and suicidal ideas.  All other systems reviewed and are negative.    Physical Exam Updated Vital Signs BP 124/82   Pulse (!) 105   Temp 98.6 F (37 C) (Oral)   Resp 20   Ht 5\' 4"  (1.626 m)   Wt 72.6 kg (160 lb)   SpO2 97%   BMI 27.46 kg/m   Physical Exam  Constitutional: He is oriented to person, place, and time. He appears well-developed and well-nourished. No distress.  HENT:  Head: Normocephalic and atraumatic.  Right Ear: Hearing normal.  Left Ear: Hearing normal.  Nose: Nose normal.  Mouth/Throat: Oropharynx is clear and moist and mucous membranes are normal.  Eyes: Pupils are equal, round, and reactive to light. Conjunctivae and EOM are normal.  Neck: Normal range of motion. Neck supple.  Cardiovascular: Regular rhythm, S1 normal and S2 normal.  Exam reveals no gallop and no friction rub.   No murmur heard. Pulmonary/Chest: Effort normal and breath sounds normal. No respiratory distress. He exhibits no tenderness.  Abdominal: Soft. Normal appearance  and bowel sounds are normal. There is no hepatosplenomegaly. There is no tenderness. There is no rebound, no guarding, no tenderness at McBurney's point and negative Murphy's sign. No hernia.  Musculoskeletal: Normal range of motion.  Neurological: He is alert and oriented to person, place, and time. He has normal strength. No cranial nerve deficit or sensory deficit. Coordination normal. GCS eye subscore is 4. GCS verbal subscore is 5. GCS motor subscore is 6.  Skin: Skin is warm, dry and intact. No rash noted. No cyanosis.  Psychiatric: He has a normal mood and affect. His speech is normal and  behavior is normal. Thought content normal.  Nursing note and vitals reviewed.    ED Treatments / Results  Labs (all labs ordered are listed, but only abnormal results are displayed) Labs Reviewed  COMPREHENSIVE METABOLIC PANEL - Abnormal; Notable for the following:       Result Value   Potassium 2.9 (*)    CO2 18 (*)    Glucose, Bld 294 (*)    BUN <5 (*)    Calcium 8.8 (*)    AST 154 (*)    ALT 105 (*)    Anion gap 16 (*)    All other components within normal limits  ETHANOL - Abnormal; Notable for the following:    Alcohol, Ethyl (B) 347 (*)    All other components within normal limits  ACETAMINOPHEN LEVEL - Abnormal; Notable for the following:    Acetaminophen (Tylenol), Serum <10 (*)    All other components within normal limits  RAPID URINE DRUG SCREEN, HOSP PERFORMED  CBC WITH DIFFERENTIAL/PLATELET  SALICYLATE LEVEL  I-STAT CHEM 8, ED    EKG  EKG Interpretation None       Radiology No results found.  Procedures Procedures (including critical care time)  Medications Ordered in ED Medications  potassium chloride SA (K-DUR,KLOR-CON) CR tablet 40 mEq (not administered)  metFORMIN (GLUCOPHAGE) tablet 500 mg (not administered)     Initial Impression / Assessment and Plan / ED Course  I have reviewed the triage vital signs and the nursing notes.  Pertinent labs & imaging results that were available during my care of the patient were reviewed by me and considered in my medical decision making (see chart for details).     Patient presents to the emergency department for evaluation of alcohol abuse. Patient does report a history of tremors and shakes with previous withdrawal, but no history of any seizures or significant DTs. He does have a history of depression but is not homicidal or suicidal currently. His medical screening exam, however, did reveal elevated blood sugar consistent with diabetes. He'll be started on metformin. He had mild hypokalemia, given  supplementation here in the ER. He will be discharged with Librium taper.  Final Clinical Impressions(s) / ED Diagnoses   Final diagnoses:  Alcohol abuse  Hyperglycemia    New Prescriptions New Prescriptions   CHLORDIAZEPOXIDE (LIBRIUM) 25 MG CAPSULE    50mg  PO TID x 1D, then 25-50mg  PO BID X 1D, then 25-50mg  PO QD X 1D   METFORMIN (GLUCOPHAGE) 500 MG TABLET    Take 1 tablet (500 mg total) by mouth 2 (two) times daily with a meal.     Gilda CreasePollina, Souleymane Saiki J, MD 02/21/17 [phone]

## 2017-02-21 NOTE — ED Triage Notes (Signed)
Pt st's he needs detox from ETOH  St's he has a drinking problem and drinks to much everyday,  Pt st's he has been drinking for pas 10 years
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
## 2024-02-21 ENCOUNTER — Emergency Department (HOSPITAL_COMMUNITY)
Admission: EM | Admit: 2024-02-21 | Discharge: 2024-02-21 | Disposition: A | Discharge status: Home / Self Care | Payer: MEDICAID | Attending: Emergency Medicine | Admitting: Emergency Medicine

## 2024-02-21 DIAGNOSIS — X58XXXA Exposure to other specified factors, initial encounter: Secondary | ICD-10-CM | POA: Diagnosis not present

## 2024-02-21 DIAGNOSIS — T543X1A Toxic effect of corrosive alkalis and alkali-like substances, accidental (unintentional), initial encounter: Secondary | ICD-10-CM | POA: Insufficient documentation

## 2024-02-21 DIAGNOSIS — T25422A Corrosion of unspecified degree of left foot, initial encounter: Secondary | ICD-10-CM | POA: Diagnosis present

## 2024-02-21 DIAGNOSIS — Z7984 Long term (current) use of oral hypoglycemic drugs: Secondary | ICD-10-CM | POA: Insufficient documentation

## 2024-02-21 DIAGNOSIS — Y929 Unspecified place or not applicable: Secondary | ICD-10-CM | POA: Insufficient documentation

## 2024-02-21 DIAGNOSIS — Z23 Encounter for immunization: Secondary | ICD-10-CM | POA: Insufficient documentation

## 2024-02-21 DIAGNOSIS — T304 Corrosion of unspecified body region, unspecified degree: Secondary | ICD-10-CM

## 2024-02-21 MED ORDER — OXYCODONE-ACETAMINOPHEN 5-325 MG PO TABS: 1.0000 | ORAL_TABLET | Freq: Three times a day (TID) | ORAL | 0 refills | Status: DC | PRN

## 2024-02-21 MED ORDER — OXYCODONE-ACETAMINOPHEN 5-325 MG PO TABS
1.0000 | ORAL_TABLET | Freq: Once | ORAL | Status: AC
  Administered 2024-02-21: 1 via ORAL
  Filled 2024-02-21: qty 1

## 2024-02-21 MED ORDER — KETOROLAC TROMETHAMINE 15 MG/ML IJ SOLN
15.0000 mg | Freq: Once | INTRAMUSCULAR | Status: AC
  Administered 2024-02-21: 15 mg via INTRAMUSCULAR
  Filled 2024-02-21: qty 1

## 2024-02-21 MED ORDER — OXYCODONE-ACETAMINOPHEN 5-325 MG PO TABS: 1.0000 | ORAL_TABLET | Freq: Three times a day (TID) | ORAL | 0 refills | Status: AC | PRN

## 2024-02-21 MED ORDER — TETANUS-DIPHTH-ACELL PERTUSSIS 5-2.5-18.5 LF-MCG/0.5 IM SUSY
0.5000 mL | PREFILLED_SYRINGE | Freq: Once | INTRAMUSCULAR | Status: AC
  Administered 2024-02-21: 0.5 mL via INTRAMUSCULAR
  Filled 2024-02-21: qty 0.5

## 2024-02-21 NOTE — Discharge Instructions (Addendum)
 Continue the treatments you have been doing.  Take pain medicines to help.  Follow-up with the resources you have been previously given.
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
## 2024-02-27 ENCOUNTER — Encounter (HOSPITAL_COMMUNITY): Payer: Self-pay

## 2024-02-27 ENCOUNTER — Emergency Department (HOSPITAL_COMMUNITY)
Admission: EM | Admit: 2024-02-27 | Discharge: 2024-02-27 | Disposition: A | Discharge status: Home / Self Care | Payer: Worker's Compensation | Attending: Emergency Medicine | Admitting: Emergency Medicine

## 2024-02-27 ENCOUNTER — Other Ambulatory Visit: Payer: Self-pay

## 2024-02-27 DIAGNOSIS — Y9289 Other specified places as the place of occurrence of the external cause: Secondary | ICD-10-CM | POA: Insufficient documentation

## 2024-02-27 DIAGNOSIS — X58XXXD Exposure to other specified factors, subsequent encounter: Secondary | ICD-10-CM | POA: Insufficient documentation

## 2024-02-27 DIAGNOSIS — T6591XD Toxic effect of unspecified substance, accidental (unintentional), subsequent encounter: Secondary | ICD-10-CM | POA: Diagnosis not present

## 2024-02-27 DIAGNOSIS — R070 Pain in throat: Secondary | ICD-10-CM | POA: Diagnosis present

## 2024-02-27 DIAGNOSIS — T25422D Corrosion of unspecified degree of left foot, subsequent encounter: Secondary | ICD-10-CM | POA: Insufficient documentation

## 2024-02-27 DIAGNOSIS — T7840XA Allergy, unspecified, initial encounter: Secondary | ICD-10-CM | POA: Diagnosis not present

## 2024-02-27 MED ORDER — DEXAMETHASONE SODIUM PHOSPHATE 10 MG/ML IJ SOLN
10.0000 mg | Freq: Once | INTRAMUSCULAR | Status: DC
Start: 2024-02-27 — End: 2024-02-27
  Filled 2024-02-27: qty 1

## 2024-02-27 MED ORDER — DEXAMETHASONE SODIUM PHOSPHATE 10 MG/ML IJ SOLN
10.0000 mg | Freq: Once | INTRAMUSCULAR | Status: AC
  Administered 2024-02-27: 10 mg via INTRAMUSCULAR

## 2024-02-27 MED ORDER — DIPHENHYDRAMINE HCL 25 MG PO CAPS
25.0000 mg | ORAL_CAPSULE | Freq: Once | ORAL | Status: AC
  Administered 2024-02-27: 25 mg via ORAL
  Filled 2024-02-27: qty 1
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
## 2024-02-27 NOTE — Discharge Instructions (Addendum)
 It was a pleasure taking care of you today.  Based on your history and physical exam I feel you are safe for discharge. Today you were treated for a possible allergic reaction due to a topical cream that was applied to your foot.  You were given Benadryl  as well as a steroid shot with improvement of your symptoms.  Please continue to monitor your symptoms at home and make sure they do not worsen.  Please return to the emergency department or seek further medical care if experiencing the following symptoms including but not limited to shortness of breath, throat swelling, mouth swelling, facial swelling, severe pain, or other concerning symptom.  Recommend follow-up with primary care provider within 48 hours if symptoms persist.  The chemical burn on your left foot overall looks well-healing.  Please continue to keep the wound clean and dry.  Please take over-the-counter ibuprofen for your symptoms and pay special attention to daily dosing on the bottle, do not exceed daily limits.

## 2024-02-27 NOTE — ED Provider Notes (Signed)
 [facility] AT [facility] Provider Note   CSN: [id number] Arrival date & time: 02/27/24  1125     Patient presents with: Foot Pain   Steven Gonzalez is a 43 y.o. male who presents emergency department with a chief complaint of possible allergic reaction to medication.  Patient states that approximately 1 week ago he sustained a chemical burn on his left foot when using oven cleaning chemicals at work.  Patient was seen in the emergency department for this burn and was given outpatient pain medications.  Patient states that he went to an urgent care as well where they applied cream which he believes he may be allergic to yesterday. Per chart review patient was given Silvadene cream which does contain sulfa, patient states that he was prescribed this medication outpatient by the urgent care as well and the pharmacist refused to fill it.  Patient states that currently he just feels woozy, as well as a little bit of throat discomfort.  He denies oral swelling, tongue swelling, trouble swallowing, trouble breathing.  Denies fever, chills, chest pain, shortness of breath.    Foot Pain       Prior to Admission medications   Medication Sig Start Date End Date Taking? Authorizing Provider  chlordiazePOXIDE  (LIBRIUM ) 25 MG capsule 50mg  PO TID x 1D, then 25-50mg  PO BID X 1D, then 25-50mg  PO QD X 1D 02/21/17   Pollina, Lonni PARAS, MD  metFORMIN  (GLUCOPHAGE ) 500 MG tablet Take 1 tablet (500 mg total) by mouth 2 (two) times daily with a meal. 02/21/17   Pollina, Lonni PARAS, MD  nicotine  polacrilex (NICORETTE ) 2 MG gum Take 1 each (2 mg total) by mouth as needed for smoking cessation. 09/29/15   Collene Gouge I, NP  oxyCODONE -acetaminophen  (PERCOCET/ROXICET) 5-325 MG tablet Take 1-2 tablets by mouth every 8 (eight) hours as needed for severe pain (pain score 7-10). 02/21/24   Patsey Lot, MD  traZODone  (DESYREL ) 50 MG tablet Take 1 tablet (50 mg total) by mouth at  bedtime and may repeat dose one time if needed. For sleep 09/29/15   Collene Gouge I, NP    Allergies: Penicillins and Sulfa antibiotics    Review of Systems  HENT:         Throat discomfort    Updated Vital Signs BP 113/81 (BP Location: Left Arm)   Pulse 84   Temp 97.7 Gonzalez (36.5 C) (Oral)   Resp 17   SpO2 100%   Physical Exam Vitals and nursing note reviewed.  Constitutional:      General: He is awake. He is not in acute distress.    Appearance: Normal appearance. He is not ill-appearing, toxic-appearing or diaphoretic.     Comments: Ambulatory without significant discomfort.  HENT:     Head: Normocephalic and atraumatic.     Mouth/Throat:     Comments: Oropharynx clear, no obvious oral swelling, no swelling of tongue, no uvular deviation, no sign of throat swelling or closing. Eyes:     General: No scleral icterus. Cardiovascular:     Rate and Rhythm: Normal rate and regular rhythm.  Pulmonary:     Effort: Pulmonary effort is normal. No respiratory distress.     Breath sounds: Normal breath sounds. No wheezing, rhonchi or rales.  Musculoskeletal:        General: Normal range of motion.  Skin:    General: Skin is warm and dry.     Capillary Refill: Capillary refill takes less than 2 seconds.  Comments: Chemical burn on left foot looks well-healing, no sign or evidence of infection at this time.  Neurological:     General: No focal deficit present.     Mental Status: He is alert and oriented to person, place, and time.  Psychiatric:        Mood and Affect: Mood normal.        Behavior: Behavior normal. Behavior is cooperative.     (all labs ordered are listed, but only abnormal results are displayed) Labs Reviewed - No data to display  EKG: None  Radiology: No results found.   Procedures   Medications Ordered in the ED  diphenhydrAMINE  (BENADRYL ) capsule 25 mg (25 mg Oral Given 02/27/24 1504)  dexamethasone  (DECADRON ) injection 10 mg (10 mg Intramuscular  Given 02/27/24 1506)                                    Medical Decision Making Risk Prescription drug management.   Patient presents to the ED for concern of possible allergic reaction to medication, this involves an extensive number of treatment options, and is a complaint that carries with it a high risk of complications and morbidity.  The differential diagnosis includes anaphylaxis, contact dermatitis, hives, itching, etc.   Co morbidities that complicate the patient evaluation  Sulfa allergy, penicillin allergy, major depressive disorder, alcohol use disorder, recent chemical burn   Additional history obtained:  I reviewed this patient's emergency department visit on 02/21/2024 when he ever he was seen for a chemical burn.  At this time patient was given outpatient Percocet for pain control.  No signs of infection at this visit.   Lab Tests:  I Ordered, and personally interpreted labs.  The pertinent results include: None   Medicines ordered and prescription drug management:  I ordered medication including Benadryl  and dexamethasone  for possible allergic reaction Reevaluation of the patient after these medicines showed that the patient improved I have reviewed the patients home medicines and have made adjustments as needed   Test Considered:  CBC, BMP: Declined at this time as patient vital signs stable, no signs of infection, wound looks well-healed when compared to previous image in chart, patient only concerned about possible allergic reaction to cream given by urgent care VBG: Declined at this time as patient talking in full sentences on room air, no signs of impending respiratory distress, no tripoding, no drooling or other red flag symptoms   Critical Interventions:  None   Problem List / ED Course:  43 year old male, recent chemical burn, given and prescribed Silvadene cream by urgent care, pharmacist would not fill due to patient having a sulfa allergy,  patient now believes he may have be having an allergic reaction to the cream Patient states that he does have some throat discomfort as well as overall feeling woozy, patient does appreciate marijuana use recently Overall area of chemical burn looks well-healing, no signs of infection, compared to image in chart from previous visit to the emergency department on 02/11/2024 Patient given dose of Benadryl  as well as dexamethasone  for possible allergic reaction On reassessment patient feeling improved and states that he is ready for discharge Return precautions given Patient discharged Patient instructed to continue to keep area of chemical burn clean and dry, instructed to take over-the-counter ibuprofen as needed for pain, instructed to avoid Tylenol  use due to his history of chronic alcohol dependence, I elected not to continue with  narcotic medications for pain control at this time outpatient Patient instructed to follow-up with primary care and specialist as scheduled, sooner if symptoms warrant Based off of presentation today, unsure if patient is presenting with a true allergic reaction, clinically patient stable, vital signs stable, patient talking in full sentences on room air, however patient treated with Benadryl  as well as dexamethasone  for possible allergic reaction, on reassessment patient feeling improved, chemical burn of left foot overall looks well-healing, no signs of infection.     Reevaluation:  After the interventions noted above, I reevaluated the patient and found that they have :improved   Social Determinants of Health:  Drug use, no primary care provider in system   Dispostion:  After consideration of the diagnostic results and the patients response to treatment, I feel that the patent would benefit from discharge.     Final diagnoses:  Chemical burn of left foot, subsequent encounter  Allergic reaction to drug, initial encounter    ED Discharge Orders      None          Steven Vanderveen F, PA-C 02/27/24 2350    Geraldene Hamilton, MD 03/01/24 2337

## 2024-02-27 NOTE — ED Notes (Signed)
 Pt ambulated well,from triage room to fast track room 9
